# Patient Record
Sex: Male | Born: 1985 | Race: White | Hispanic: No | Smoking: Never smoker
Health system: Southern US, Community
[De-identification: ages and names within clinical notes are randomized; demographics above are authoritative.]

---

## 2016-12-31 ENCOUNTER — Other Ambulatory Visit: Payer: Self-pay | Admitting: *Deleted

## 2016-12-31 DIAGNOSIS — M21372 Foot drop, left foot: Secondary | ICD-10-CM

## 2017-01-04 ENCOUNTER — Ambulatory Visit (INDEPENDENT_AMBULATORY_CARE_PROVIDER_SITE_OTHER): Payer: BLUE CROSS/BLUE SHIELD | Admitting: Neurology

## 2017-01-04 DIAGNOSIS — M21372 Foot drop, left foot: Secondary | ICD-10-CM

## 2017-01-04 NOTE — Procedures (Signed)
[facility]  [address], [address]  [place], [place] [address] Tel: [phone] Fax:  [phone] Test Date:  01/04/2017  Patient: Douglas Montoya DOB: 08/13/1985 Physician: Nita Sickleonika Momina Hunton, DO  Sex: Male Height: 6' " Ref Phys: Albertha Gheeebecca Bassett, MD  ID#: [id number] Temp: 33.2C Technician:    Patient Complaints: This is a 31 year-old gentleman referred for evaluation of intermittent left foot drop, pain, and swelling.  NCV & EMG Findings: Extensive electrodiagnostic testing of the left lower extremity and additional studies of the right shows:  1. Bilateral superficial peroneal sensory responses are symmetric and within normal limits. Left sural sensory responses also within normal limits. 2. Bilateral peroneal motor responses at the extensor digitorum brevis showed reduced amplitude, worse on the left (L1.8, R2.6 mV). Bilateral peroneal motor response at the tibialis anterior are within normal limits. 3. Sparse chronic motor axon loss changes are isolated to bilateral extensor digitorum brevis muscles, with increased insertional activity on the left.    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  In particular, there is no evidence of a lumbosacral radiculopathy or left entrapment neuropathy.   ___________________________ Nita Sickleonika Aadit Hagood, DO    Nerve Conduction Studies Anti Sensory Summary Table   Site NR Peak (ms) Norm Peak (ms) P-T Amp (V) Norm P-T Amp  Left Sup Peroneal Anti Sensory (Ant Lat Mall)  33.2C  12 cm    3.1 <4.5 11.8 >5  Site 2    3.1  11.7   Right Sup Peroneal Anti Sensory (Ant Lat Mall)  33.2C  12 cm    2.6 <4.5 11.3 >5  Left Sural Anti Sensory (Lat Mall)  33.2C  Calf    3.7 <4.5 8.7 >5   Motor Summary Table   Site NR Onset (ms) Norm Onset (ms) O-P Amp (mV) Norm O-P Amp  Site1 Site2 Delta-0 (ms) Dist (cm) Vel (m/s) Norm Vel (m/s)  Left Peroneal Motor (Ext Dig Brev)  33.2C  Ankle    3.4 <5.5 1.8 >3 B Fib Ankle 7.5 37.0 49 >40  B Fib    10.9  1.6  Poplt B Fib 1.6 10.0 63 >40  Poplt    12.5  1.5         Right Peroneal Motor (Ext Dig Brev)  33.2C  Ankle    3.6 <5.5 2.6 >3 B Fib Ankle 7.2 38.0 53 >40  B Fib    10.8  2.3  Poplt B Fib 1.2 8.0 67 >40  Poplt    12.0  2.4         Left Peroneal TA Motor (Tib Ant)  33.2C  Fib Head    3.3 <4.0 6.3 >4 Poplit Fib Head 0.8 10.0 125 >40  Poplit    4.1  6.0         Right Peroneal TA Motor (Tib Ant)  33.2C  Fib Head    2.8 <4.0 9.1 >4 Poplit Fib Head 1.0 8.0 80 >40  Poplit    3.8  8.8         Left Tibial Motor (Abd Hall Brev)  33.2C  Ankle    5.1 <6.0 11.4 >8 Knee Ankle 7.3 43.0 59 >40  Knee    12.4  10.0          H Reflex Studies   NR H-Lat (ms) Lat Norm (ms) L-R H-Lat (ms)  Left Tibial (Gastroc)  33.2C     34.15 <35    EMG   Side Muscle Ins Act Fibs Psw Fasc Number Recrt Dur Dur. Amp Amp. Poly Poly. Comment  Left AntTibialis Nml Nml Nml Nml Nml Nml Nml Nml Nml Nml Nml Nml N/A  Left Gastroc Nml Nml Nml Nml Nml Nml Nml Nml Nml Nml Nml Nml N/A  Left Flex Dig Long Nml Nml Nml Nml Nml Nml Nml Nml Nml Nml Nml Nml N/A  Left Ext Dig Brev 1+ Nml Nml Nml 1- Rapid Few 1+ Few 1+ Nml Nml N/A  Left RectFemoris Nml Nml Nml Nml Nml Nml Nml Nml Nml Nml Nml Nml N/A  Left GluteusMed Nml Nml Nml Nml Nml Nml Nml Nml Nml Nml Nml Nml N/A  Right AntTibialis Nml Nml Nml Nml Nml Nml Nml Nml Nml Nml Nml Nml N/A  Right Ext Dig Brev Nml Nml Nml Nml 1- Rapid Few 1+ Few 1+ Nml Nml N/A      Waveforms:

## 2019-06-20 ENCOUNTER — Other Ambulatory Visit: Payer: Self-pay | Admitting: Podiatry

## 2019-06-20 ENCOUNTER — Ambulatory Visit: Payer: BC Managed Care – PPO | Admitting: Podiatry

## 2019-06-20 ENCOUNTER — Ambulatory Visit (INDEPENDENT_AMBULATORY_CARE_PROVIDER_SITE_OTHER): Payer: BC Managed Care – PPO

## 2019-06-20 ENCOUNTER — Encounter: Payer: Self-pay | Admitting: Podiatry

## 2019-06-20 ENCOUNTER — Other Ambulatory Visit: Payer: Self-pay

## 2019-06-20 DIAGNOSIS — G5763 Lesion of plantar nerve, bilateral lower limbs: Secondary | ICD-10-CM | POA: Diagnosis not present

## 2019-06-20 DIAGNOSIS — M778 Other enthesopathies, not elsewhere classified: Secondary | ICD-10-CM

## 2019-06-20 DIAGNOSIS — M2041 Other hammer toe(s) (acquired), right foot: Secondary | ICD-10-CM

## 2019-06-25 NOTE — Progress Notes (Signed)
   HPI: 33 y.o. male presenting today 33 year old male otherwise healthy presents for evaluation regarding bilateral foot pain.  Patient has seen 3 previous doctors for the same symptoms over the past 3 months without a definitive diagnosis.  Patient states that for the past 7 years he gets intermittent swelling and inflammation to his bilateral feet.  He is unable to walk long distances without pain.  He has tried different anti-inflammatories and is currently on gabapentin.  He has had MRIs in the past which have not helped to confirm any diagnosis previously.  He presents today for another opinion and to see if there is any other treatment options.  History reviewed. No pertinent past medical history.   Physical Exam: General: The patient is alert and oriented x3 in no acute distress.  Dermatology: Skin is warm, dry and supple bilateral lower extremities. Negative for open lesions or macerations.  Vascular: Palpable pedal pulses bilaterally. No edema or erythema noted. Capillary refill within normal limits.  Neurological: Epicritic and protective threshold grossly intact bilaterally.   Musculoskeletal Exam: Range of motion within normal limits to all pedal and ankle joints bilateral. Muscle strength 5/5 in all groups bilateral.  Tenderness to palpation along the dorsal aspect of bilateral feet.  There is also pain on palpation to the fourth interspace bilaterally consistent with possible Morton's neuroma.  Radiographic Exam:  Normal osseous mineralization. Joint spaces preserved. No fracture/dislocation/boney destruction.    Assessment: 1.  Intermittent swelling/inflammation with activity bilateral feet along the fourth ray 2.  Morton's neuroma fourth interspace bilateral   Plan of Care:  1. Patient evaluated. X-Rays reviewed.  2.  The patient has been diagnosed with several previous diagnoses that have not been truly definitive including gout, and the patient has had elevated uric acids  in the past.  This is somewhat inconsistent clinically due to the fact that when the patient is on his feet for longer than a short period of time he immediately gets pain and tenderness for the next 2-3 days following. 3.  I did explain to the patient that I am not 100% sure of any other diagnosis that is been overlooked.  Patient has received some relief from a neuroma injection he received by Dr. Odis Hollingshead in the past. 4.  Injection of 0.5 cc Celestone Soluspan injection of the fourth interspace left.  Patient recently had an injection to the fourth interspace of the right foot.   5.  Recommend that the patient goes about his normal walking activity and see if the injections help to improve his pain to rule out neuromas bilaterally 6.  Return to clinic in 1 week to see if there is improvement      Edrick Kins, DPM [facility]  Dr. Edrick Kins, DPM    [address], [place] [address]                Office [phone]  Fax [phone]

## 2019-06-27 ENCOUNTER — Ambulatory Visit: Payer: BC Managed Care – PPO | Admitting: Podiatry

## 2019-06-27 ENCOUNTER — Other Ambulatory Visit: Payer: Self-pay

## 2019-06-27 DIAGNOSIS — G5763 Lesion of plantar nerve, bilateral lower limbs: Secondary | ICD-10-CM | POA: Diagnosis not present

## 2019-07-02 NOTE — Progress Notes (Signed)
   Subjective: 33 y.o. male presenting today for follow up evaluation of bilateral Morton's neuromas. He states the pain has improved. He does reports some pain to the lateral aspect of the feet. He states the injections helped to alleviate his pain. He denies any current aggravating factors. Patient is here for further evaluation and treatment.   No past medical history on file.   Objective: Physical Exam General: The patient is alert and oriented x3 in no acute distress.  Dermatology: Skin is cool, dry and supple bilateral lower extremities. Negative for open lesions or macerations.  Vascular: Palpable pedal pulses bilaterally. No edema or erythema noted. Capillary refill within normal limits.  Neurological: Epicritic and protective threshold grossly intact bilaterally.   Musculoskeletal Exam: Clinical evidence of Tailor's bunion deformity noted to the respective foot. There is a moderate pain on palpation range of motion of the fifth MPJ.    Assessment: 1. Tailor's bunion deformity bilateral  2. Morton's neuroma bilateral 3rd interspace - resolved    Plan of Care:  1. Patient was evaluated.  2. Continue wearing good shoe gear.  3. Injections seemed to have relieved patient's symptoms.  4. Continue conservative treatment for now.  5. Return to clinic as needed for injections.        Edrick Kins, DPM [facility]  Dr. Edrick Kins, [address]                                        [place], [place] [address]                Office [phone]  Fax [phone]

## 2019-07-09 ENCOUNTER — Encounter: Payer: Self-pay | Admitting: Podiatry

## 2019-07-09 ENCOUNTER — Other Ambulatory Visit: Payer: Self-pay

## 2019-07-09 ENCOUNTER — Ambulatory Visit: Payer: BC Managed Care – PPO | Admitting: Podiatry

## 2019-07-09 DIAGNOSIS — G5763 Lesion of plantar nerve, bilateral lower limbs: Secondary | ICD-10-CM | POA: Diagnosis not present

## 2019-07-15 NOTE — Progress Notes (Signed)
   Subjective: 34 y.o. male presenting today for follow up evaluation of bilateral Morton's neuromas of the 3rd interspace and a Tailor's bunion of the left foot. He reports pain from the neuromas in the past week. Walking increases the pain. He has not done anything at home recently for treatment. Patient is here for further evaluation and treatment.   History reviewed. No pertinent past medical history.   Objective: Physical Exam General: The patient is alert and oriented x3 in no acute distress.  Dermatology: Skin is cool, dry and supple bilateral lower extremities. Negative for open lesions or macerations.  Vascular: Palpable pedal pulses bilaterally. No edema or erythema noted. Capillary refill within normal limits.  Neurological: Epicritic and protective threshold grossly intact bilaterally.   Musculoskeletal Exam: Clinical evidence of Tailor's bunion deformity noted to the respective foot. There is a moderate pain on palpation range of motion of the fifth MPJ.    Assessment: 1. Tailor's bunion deformity left  2. Morton's neuroma bilateral 3rd interspace - currently asymptomatic    Plan of Care:  1. Patient was evaluated.  2. Injection of 0.5 mLs Celestone Soluspan injected into the Morton's neuroma of the right foot and the IDCN.  3. Recommended good shoe gear.  4. Return to clinic as needed. If symptoms recur, we will discuss surgery.       Felecia Shelling, DPM [facility]  Dr. Felecia Shelling, DPM    [address]                                        [place], [place] [address]                Office [phone]  Fax [phone]

## 2019-07-23 ENCOUNTER — Other Ambulatory Visit: Payer: Self-pay

## 2019-07-23 ENCOUNTER — Encounter: Payer: Self-pay | Admitting: Podiatry

## 2019-07-23 ENCOUNTER — Ambulatory Visit (INDEPENDENT_AMBULATORY_CARE_PROVIDER_SITE_OTHER): Payer: BC Managed Care – PPO | Admitting: Podiatry

## 2019-07-23 DIAGNOSIS — G5763 Lesion of plantar nerve, bilateral lower limbs: Secondary | ICD-10-CM | POA: Diagnosis not present

## 2019-07-23 DIAGNOSIS — M21621 Bunionette of right foot: Secondary | ICD-10-CM

## 2019-07-23 DIAGNOSIS — M21622 Bunionette of left foot: Secondary | ICD-10-CM | POA: Diagnosis not present

## 2019-07-26 ENCOUNTER — Telehealth: Payer: Self-pay | Admitting: Podiatry

## 2019-07-26 ENCOUNTER — Telehealth: Payer: Self-pay | Admitting: *Deleted

## 2019-07-26 NOTE — Telephone Encounter (Signed)
"  I need some information about my surgery."  What date are you scheduled?  "I'm scheduled to have surgery on March 4."  What questions do you have?  How can I help you?  "How long will I need to be out of work?"  You'll probably be out about six to eight weeks.  "Okay, that's all I needed."

## 2019-07-26 NOTE — Telephone Encounter (Signed)
Douglas Montoya would like to speak with you about his surgery, he said that he is scheduled for 09/13/2019, but he has some other questions. He would like for you to give him a call ASAP.

## 2019-07-26 NOTE — Progress Notes (Signed)
   Subjective: 34 y.o. male presenting today for follow up evaluation of bilateral Morton's neuromas of the 3rd interspace and Tailor's bunion of the bilateral feet. He states he has been off of the feet for the past two weeks. He denies any swelling but reports some continued pain. He states the injections he got at his last appointment helped alleviate the pain for a few days. Patient is here for further evaluation and treatment.   No past medical history on file.   Objective: Physical Exam General: The patient is alert and oriented x3 in no acute distress.  Dermatology: Skin is cool, dry and supple bilateral lower extremities. Negative for open lesions or macerations.  Vascular: Palpable pedal pulses bilaterally. No edema or erythema noted. Capillary refill within normal limits.  Neurological: Epicritic and protective threshold grossly intact bilaterally.   Musculoskeletal Exam: Clinical evidence of Tailor's bunion deformity noted to the respective foot. There is a moderate pain on palpation range of motion of the fifth MPJ.  Sharp pain with palpation of the bilateral 3rd interspace and lateral compression of the metatarsal heads consistent with neuroma.  Positive Lendell Caprice sign with loadbearing of the forefoot.    Assessment: 1. Tailor's bunion deformity bilateral  2. Morton's neuroma bilateral 3rd interspace - recurrent    Plan of Care:  1. Patient was evaluated.  2. Today we discussed the conservative versus surgical management of the presenting pathology. The patient opts for surgical management. All possible complications and details of the procedure were explained. All patient questions were answered. No guarantees were expressed or implied. 3. Authorization for surgery was initiated today. Surgery will consist of Morton's neurectomy bilateral 3rd interspace; Tailor's bunionectomy with 5th metatarsal osteotomy bilateral.  4. Return to clinic one week post op.      Felecia Shelling, DPM [facility]  Dr. Felecia Shelling, DPM    [address]                                        [place], [place] [address]                Office [phone]  Fax [phone]

## 2019-08-01 ENCOUNTER — Ambulatory Visit: Payer: BC Managed Care – PPO | Admitting: Podiatry

## 2019-09-05 ENCOUNTER — Telehealth: Payer: Self-pay

## 2019-09-05 NOTE — Telephone Encounter (Signed)
DOS 09/13/19  METATARSAL OSTEOTOMY 5TH B/L - 28308 NEURECTOMY 3RD UINTERSPACE B/L-28080  [employer] EFFECTIVE DATE - 07/12/2018  PLAN DEDUCTIBLE - $600 W/ $472.67 REMAINING  OUT OF POCKET - $3500 W/ $3272.67 REMAINING  CO INSURANCE - 20%  SPOKE TO Douglas Montoya AT [facility] MANAGEMENT [phone], PRE-CERT NOT REQUIRED FOR THIS POLICY.  CALL REF # - [phone]

## 2019-09-13 ENCOUNTER — Encounter: Payer: Self-pay | Admitting: Podiatry

## 2019-09-13 ENCOUNTER — Other Ambulatory Visit: Payer: Self-pay | Admitting: Podiatry

## 2019-09-13 DIAGNOSIS — M21541 Acquired clubfoot, right foot: Secondary | ICD-10-CM

## 2019-09-13 DIAGNOSIS — G5761 Lesion of plantar nerve, right lower limb: Secondary | ICD-10-CM

## 2019-09-13 DIAGNOSIS — M21542 Acquired clubfoot, left foot: Secondary | ICD-10-CM

## 2019-09-13 DIAGNOSIS — G5762 Lesion of plantar nerve, left lower limb: Secondary | ICD-10-CM

## 2019-09-13 MED ORDER — OXYCODONE-ACETAMINOPHEN 5-325 MG PO TABS: 1.0000 | ORAL_TABLET | Freq: Four times a day (QID) | ORAL | 0 refills | Status: DC | PRN

## 2019-09-13 NOTE — Progress Notes (Signed)
PRN postop 

## 2019-09-17 ENCOUNTER — Telehealth: Payer: Self-pay | Admitting: Podiatry

## 2019-09-17 NOTE — Telephone Encounter (Signed)
Pt states in the left upper foot and ankle, it doesn't feel like surgery pain, and his rheumatology appt before the surgery he informed his rheumatologist that he felt something was coming on and he was prescribed prednisone, but was told not to take until later in the surgery. Pt states he redressed the surgery foot without lasting pain relief results. I asked pt if he had another medication also. Pt states colchicine. I told pt he could take that daily until the symptoms disappeared but not at the same time as the pain medication. Pt states understanding.

## 2019-09-17 NOTE — Telephone Encounter (Signed)
Pt had surgery on 09/13/19 and is experiencing pain and swelling in his ankle and would like to know if he able to start taking his medication again for gout. He believes he is having a flare up. Please give patient a call.

## 2019-09-17 NOTE — Telephone Encounter (Signed)
Sure

## 2019-09-24 ENCOUNTER — Other Ambulatory Visit: Payer: Self-pay

## 2019-09-24 ENCOUNTER — Ambulatory Visit (INDEPENDENT_AMBULATORY_CARE_PROVIDER_SITE_OTHER): Payer: BC Managed Care – PPO | Admitting: Podiatry

## 2019-09-24 ENCOUNTER — Ambulatory Visit (INDEPENDENT_AMBULATORY_CARE_PROVIDER_SITE_OTHER): Payer: BC Managed Care – PPO

## 2019-09-24 VITALS — Temp 95.1°F

## 2019-09-24 DIAGNOSIS — G5763 Lesion of plantar nerve, bilateral lower limbs: Secondary | ICD-10-CM

## 2019-09-24 DIAGNOSIS — Z9889 Other specified postprocedural states: Secondary | ICD-10-CM

## 2019-09-24 DIAGNOSIS — M10072 Idiopathic gout, left ankle and foot: Secondary | ICD-10-CM | POA: Diagnosis not present

## 2019-09-24 MED ORDER — OXYCODONE-ACETAMINOPHEN 5-325 MG PO TABS: 1.0000 | ORAL_TABLET | Freq: Four times a day (QID) | ORAL | 0 refills | Status: AC | PRN

## 2019-09-24 MED ORDER — CYCLOBENZAPRINE HCL 10 MG PO TABS: 10.0000 mg | ORAL_TABLET | Freq: Three times a day (TID) | ORAL | 0 refills | Status: AC | PRN

## 2019-09-26 NOTE — Progress Notes (Signed)
   Subjective:  Patient presents today status post neurectomy 3rd interspace and Tailor's bunionectomy bilateral. DOS: 09/13/2019. She reports moderate to severe pain of the left ankle. He states he believes he is have a gout exacerbation that began the day after surgery and has worsened in the last four days. He states he is out of Prednisone and Colchicine for gout treatment. He has been trying to stay off his feet and rest them as much as possible. Patient is here for further evaluation and treatment.    No past medical history on file.    Objective/Physical Exam Neurovascular status intact.  Skin incisions appear to be well coapted with sutures and staples intact. No sign of infectious process noted. No dehiscence. No active bleeding noted. Moderate edema noted to the surgical extremity. Pain with palpation, erythema and edema noted to the left ankle joint.   Radiographic Exam:  Orthopedic hardware and osteotomies sites appear to be stable with routine healing.  Assessment: 1. s/p neurectomy 3rd interspace and Tailor's bunionectomy bilateral. DOS: 09/13/2019 2. Acute gout inflammation left ankle    Plan of Care:  1. Patient was evaluated. X-rays reviewed 2. Injection of 0.5 mLs Celestone Soluspan injected into the left ankle joint.  3. Dressing changed.  4. CAM boot dispensed for left lower extremity.  5. Continue minimal weightbearing bilaterally.  6. Return to clinic in one week for suture removal.    Felecia Shelling, DPM [facility]  Dr. Felecia Shelling, DPM    [address]                                        [place], [place] [address]                Office [phone]  Fax [phone]

## 2019-09-28 ENCOUNTER — Encounter: Payer: Self-pay | Admitting: Podiatry

## 2019-10-01 ENCOUNTER — Encounter: Payer: BC Managed Care – PPO | Admitting: Podiatry

## 2019-10-03 ENCOUNTER — Ambulatory Visit (INDEPENDENT_AMBULATORY_CARE_PROVIDER_SITE_OTHER): Payer: BC Managed Care – PPO | Admitting: Podiatry

## 2019-10-03 ENCOUNTER — Other Ambulatory Visit: Payer: Self-pay

## 2019-10-03 DIAGNOSIS — G5763 Lesion of plantar nerve, bilateral lower limbs: Secondary | ICD-10-CM

## 2019-10-03 DIAGNOSIS — Z9889 Other specified postprocedural states: Secondary | ICD-10-CM

## 2019-10-07 NOTE — Progress Notes (Signed)
   Subjective:  Patient presents today status post neurectomy 3rd interspace and Tailor's bunionectomy bilateral. DOS: 09/13/2019. He states he is doing well. He denies any significant pain. He reports some mild associated swelling. He states the injection helped alleviate his gout symptoms. He denies worsening factors at this time. Patient is here for further evaluation and treatment.    No past medical history on file.    Objective/Physical Exam Neurovascular status intact.  Skin incisions appear to be well coapted with sutures and staples intact. No sign of infectious process noted. No dehiscence. No active bleeding noted. Moderate edema noted to the surgical extremity.  Assessment: 1. s/p neurectomy 3rd interspace and Tailor's bunionectomy bilateral. DOS: 09/13/2019 2. Acute gout inflammation left ankle - resolved    Plan of Care:  1. Patient was evaluated. 2. Sutures removed.  3. Ace wraps dispensed.  4. Continue weightbearing in post op shoes.  5. Return to clinic in 2 weeks for follow up X-Ray.    Felecia Shelling, DPM [facility]  Dr. Felecia Shelling, DPM    [address]                                        [place], [place] [address]                Office [phone]  Fax [phone]

## 2019-10-15 ENCOUNTER — Ambulatory Visit (INDEPENDENT_AMBULATORY_CARE_PROVIDER_SITE_OTHER): Payer: BC Managed Care – PPO | Admitting: Podiatry

## 2019-10-15 ENCOUNTER — Ambulatory Visit (INDEPENDENT_AMBULATORY_CARE_PROVIDER_SITE_OTHER): Payer: BC Managed Care – PPO

## 2019-10-15 ENCOUNTER — Other Ambulatory Visit: Payer: Self-pay

## 2019-10-15 DIAGNOSIS — Z9889 Other specified postprocedural states: Secondary | ICD-10-CM | POA: Diagnosis not present

## 2019-10-15 DIAGNOSIS — G5763 Lesion of plantar nerve, bilateral lower limbs: Secondary | ICD-10-CM | POA: Diagnosis not present

## 2019-10-15 DIAGNOSIS — M10072 Idiopathic gout, left ankle and foot: Secondary | ICD-10-CM

## 2019-10-18 NOTE — Progress Notes (Signed)
   Subjective:  Patient presents today status post neurectomy 3rd interspace and Tailor's bunionectomy bilateral. DOS: 09/13/2019. He states he is doing well and improving. He is concerned about a possible gout exacerbation of the left ankle. He reports associated pain, swelling and redness of the joint. Touching the area increases the pain. He has not done anything at home for treatment. Patient is here for further evaluation and treatment.    No past medical history on file.    Objective/Physical Exam Neurovascular status intact.  Skin incisions appear to be well coapted. No sign of infectious process noted. No dehiscence. No active bleeding noted. Moderate edema noted to the surgical extremity. Pain with palpation, erythema and edema noted to the left ankle joint.   Radiographic Exam:  Orthopedic hardware and osteotomies sites appear to be stable with routine healing.  Assessment: 1. s/p neurectomy 3rd interspace and Tailor's bunionectomy bilateral. DOS: 09/13/2019 2. Acute gout inflammation left ankle - recurrent    Plan of Care:  1. Patient was evaluated. X-Rays reviewed.  2. Injection of 0.5 mLs Celestone Soluspan injected into the left ankle joint.  3. Patient has follow up with rheumatology.  4. Recommended good shoe gear.  5. Return to clinic in 4 weeks for final follow up visit.     Felecia Shelling, DPM [facility]  Dr. Felecia Shelling, DPM    [address]                                        [place], [place] [address]                Office [phone]  Fax [phone]

## 2019-11-12 ENCOUNTER — Ambulatory Visit (INDEPENDENT_AMBULATORY_CARE_PROVIDER_SITE_OTHER): Payer: BC Managed Care – PPO | Admitting: Podiatry

## 2019-11-12 ENCOUNTER — Other Ambulatory Visit: Payer: Self-pay

## 2019-11-12 DIAGNOSIS — M21621 Bunionette of right foot: Secondary | ICD-10-CM

## 2019-11-12 DIAGNOSIS — G5763 Lesion of plantar nerve, bilateral lower limbs: Secondary | ICD-10-CM

## 2019-11-12 DIAGNOSIS — Z9889 Other specified postprocedural states: Secondary | ICD-10-CM

## 2019-11-12 DIAGNOSIS — M21622 Bunionette of left foot: Secondary | ICD-10-CM

## 2019-11-14 NOTE — Progress Notes (Signed)
   Subjective:  Patient presents today status post neurectomy 3rd interspace and Tailor's bunionectomy bilateral. DOS: 09/13/2019. He reports aching pain between the 4th and 5th toes. Moving the feet increases the pain. Patient is here for further evaluation and treatment.   No past medical history on file.    Objective/Physical Exam Neurovascular status intact.  Skin incisions appear to be well coapted. No sign of infectious process noted. No dehiscence. No active bleeding noted. Moderate edema noted to the surgical extremity.  Assessment: 1. s/p neurectomy 3rd interspace and Tailor's bunionectomy bilateral. DOS: 09/13/2019 2. Acute gout inflammation left ankle - resolved    Plan of Care:  1. Patient was evaluated.  2. Continue management with rheumatology.  3. From a surgical standpoint, may resume full activity with no restrictions.  4. Recommended good shoe gear. 5. Return to clinic as needed.     Felecia Shelling, DPM [facility]  Dr. Felecia Shelling, DPM    [address]                                        [place], [place] [address]                Office [phone]  Fax [phone]

## 2019-12-04 ENCOUNTER — Encounter: Payer: Self-pay | Admitting: Podiatry

## 2020-01-24 ENCOUNTER — Other Ambulatory Visit: Payer: Self-pay | Admitting: Neurological Surgery

## 2020-01-24 ENCOUNTER — Other Ambulatory Visit: Payer: Self-pay | Admitting: Family Medicine

## 2020-01-24 DIAGNOSIS — M5416 Radiculopathy, lumbar region: Secondary | ICD-10-CM

## 2020-02-18 ENCOUNTER — Other Ambulatory Visit: Payer: BC Managed Care – PPO

## 2020-03-06 ENCOUNTER — Other Ambulatory Visit: Payer: Self-pay | Admitting: Physician Assistant

## 2020-03-06 DIAGNOSIS — M5416 Radiculopathy, lumbar region: Secondary | ICD-10-CM

## 2020-03-10 ENCOUNTER — Other Ambulatory Visit: Payer: Self-pay | Admitting: Physician Assistant

## 2020-03-11 ENCOUNTER — Other Ambulatory Visit: Payer: Self-pay | Admitting: Physician Assistant

## 2020-03-11 ENCOUNTER — Other Ambulatory Visit: Payer: BC Managed Care – PPO

## 2020-03-11 DIAGNOSIS — M533 Sacrococcygeal disorders, not elsewhere classified: Secondary | ICD-10-CM

## 2020-03-27 ENCOUNTER — Other Ambulatory Visit: Payer: Self-pay | Admitting: Specialist

## 2020-03-27 DIAGNOSIS — M5417 Radiculopathy, lumbosacral region: Secondary | ICD-10-CM

## 2020-04-03 ENCOUNTER — Ambulatory Visit
Admission: RE | Admit: 2020-04-03 | Discharge: 2020-04-03 | Disposition: A | Discharge status: Home / Self Care | Payer: BC Managed Care – PPO | Source: Ambulatory Visit | Attending: Physician Assistant | Admitting: Physician Assistant

## 2020-04-03 DIAGNOSIS — M533 Sacrococcygeal disorders, not elsewhere classified: Secondary | ICD-10-CM

## 2020-04-03 MED ORDER — GADOBENATE DIMEGLUMINE 529 MG/ML IV SOLN
20.0000 mL | Freq: Once | INTRAVENOUS | Status: AC | PRN
  Administered 2020-04-03: 20 mL via INTRAVENOUS

## 2020-04-17 ENCOUNTER — Other Ambulatory Visit: Payer: BC Managed Care – PPO

## 2021-01-10 IMAGING — MR MR PELVIS WO/W CM
6 of 8 series · 35 of 48 positions shown · IV contrast (20ml Multihance)
Comparison: None.

CLINICAL DATA: Chronic intermittent right sacroiliac joint pain.

EXAM:
MRI PELVIS WITHOUT AND WITH CONTRAST
TECHNIQUE: Multiplanar multisequence MR imaging of the pelvis was performed
both before and after administration of intravenous contrast.
CONTRAST:  20mL MULTIHANCE GADOBENATE DIMEGLUMINE 529 MG/ML IV SOLN

[Series 3: T2 fat-sat · sagittal · 4.0mm · 0.75mm/px · 6 of 35 slices shown (1 of 2)]
[im 1/35]
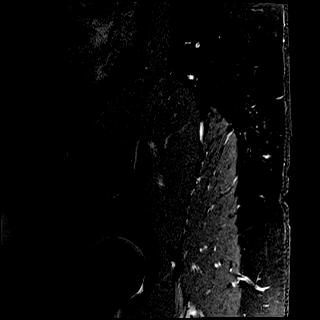
[im 7/35]
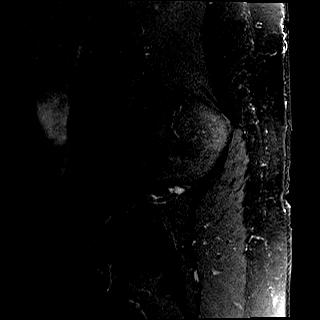
[im 14/35]
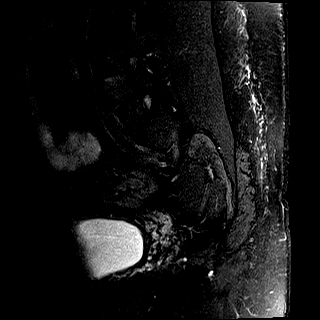
[im 21/35]
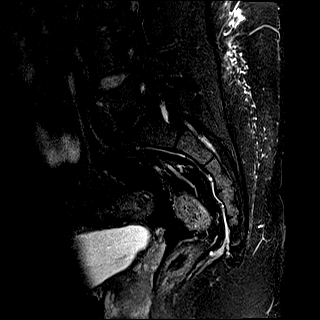
[im 28/35]
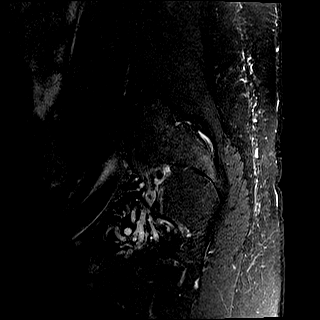
[im 35/35]
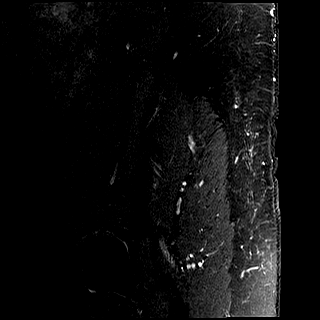

[Series 4: T1 · coronal · 3.0mm · 0.88mm/px · 4 of 26 slices shown (1 of 2)]
[im 1/26]
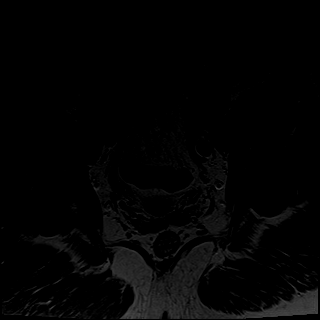
[im 9/26]
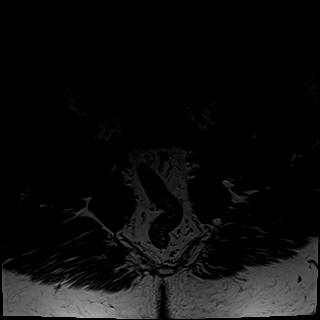
[im 17/26]
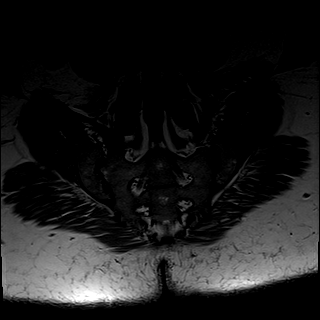
[im 26/26]
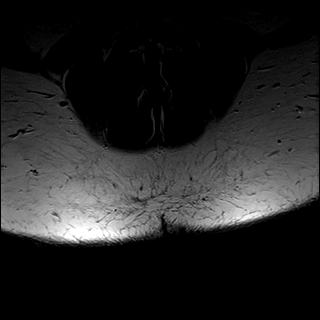

[Series 5: STIR · coronal · 3.0mm · 0.55mm/px · 4 of 26 slices shown]
[im 1/26]
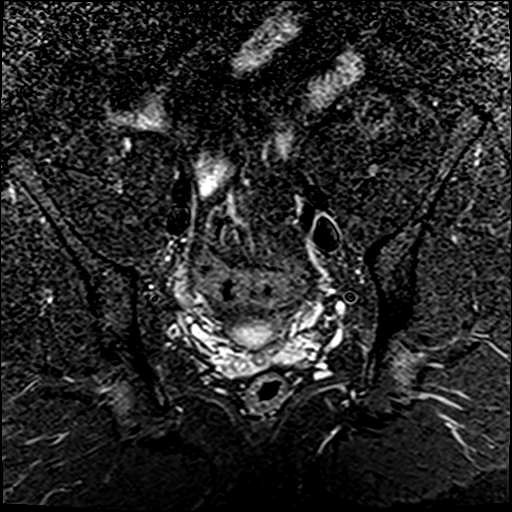
[im 9/26]
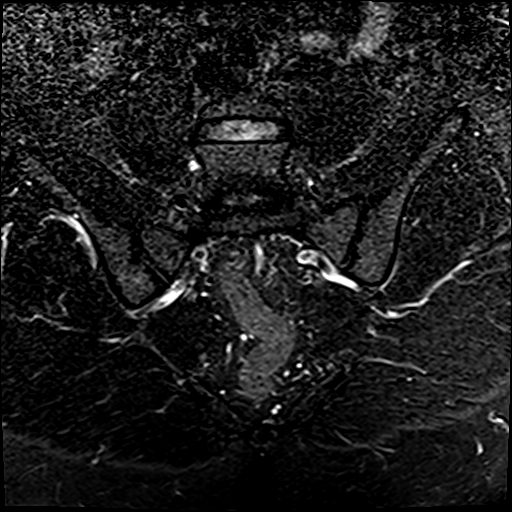
[im 17/26]
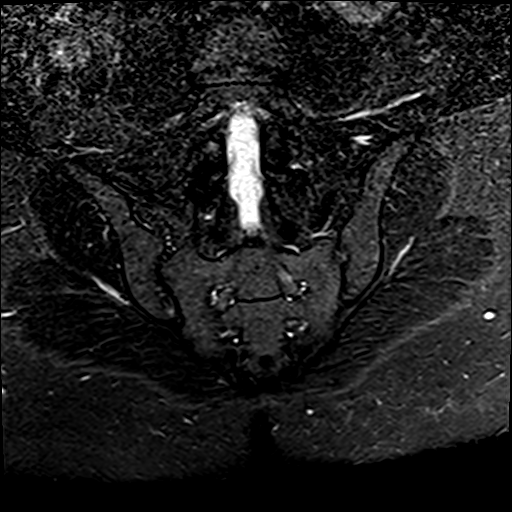
[im 26/26]
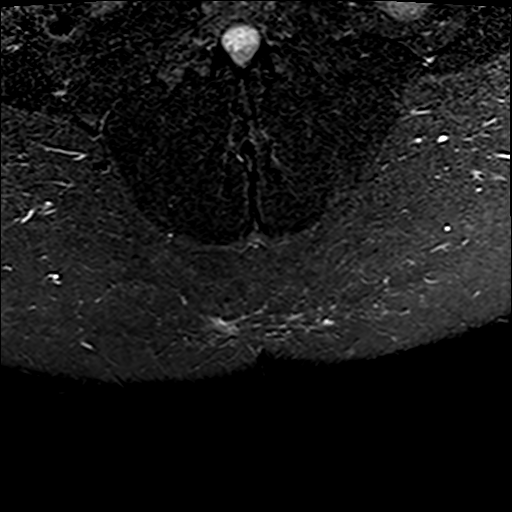

[Series 6: T2 fat-sat · axial · 4.0mm · 0.94mm/px · z∈[+6,+221]mm · 7 of 44 slices shown (2 of 2)]
[im 1/44]
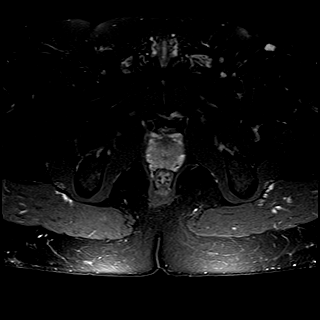
[im 8/44]
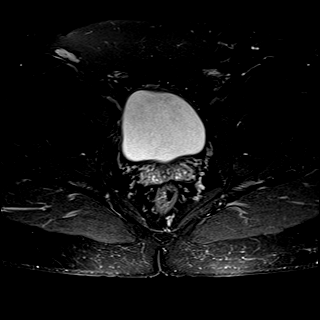
[im 15/44]
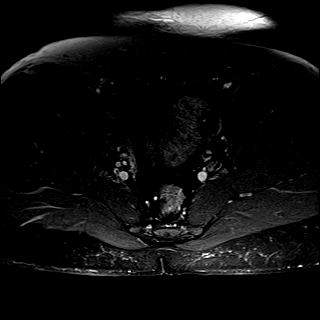
[im 22/44]
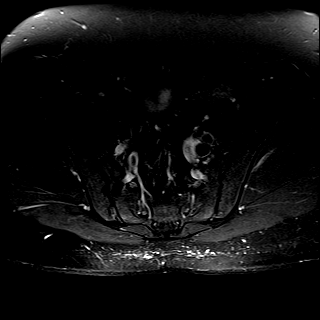
[im 29/44]
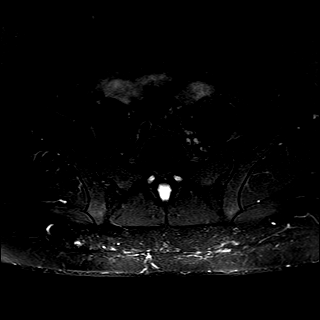
[im 36/44]
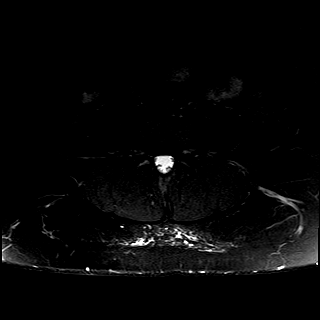
[im 44/44]
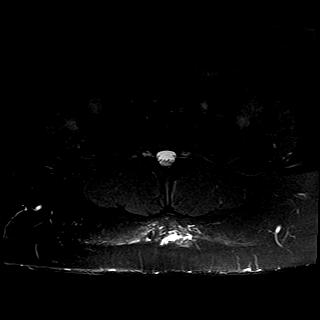

[Series 7: T1 · axial · 4.0mm · 0.94mm/px · z∈[+6,+221]mm · 7 of 44 slices shown (2 of 2)]
[im 1/44]
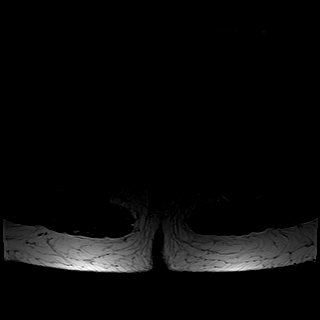
[im 8/44]
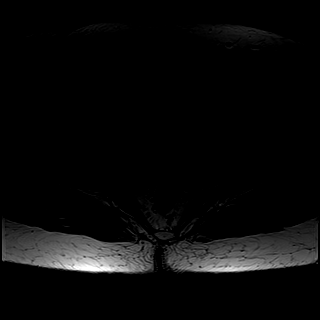
[im 15/44]
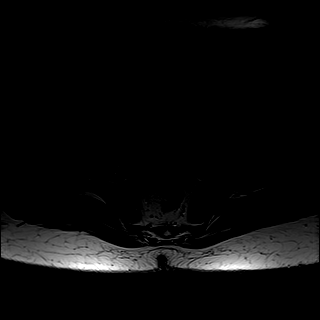
[im 22/44]
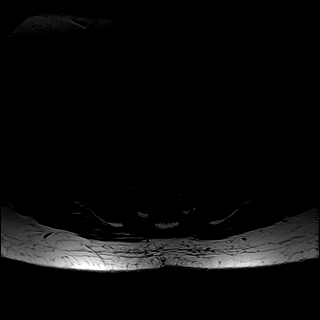
[im 29/44]
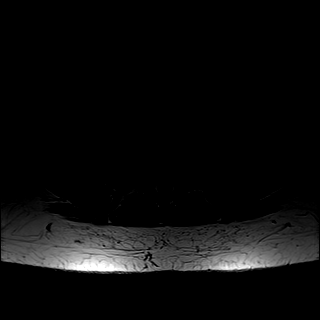
[im 36/44]
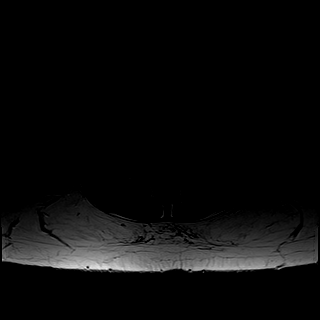
[im 44/44]
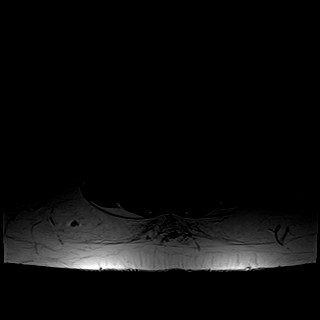

[Series 8: T1 fat-sat · axial · non-contrast · 4.0mm · 0.59mm/px · z∈[-4,+196]mm · 7 of 48 slices shown]
[im 1/48]
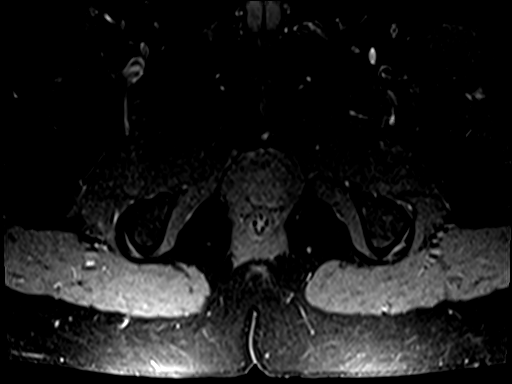
[im 7/48]
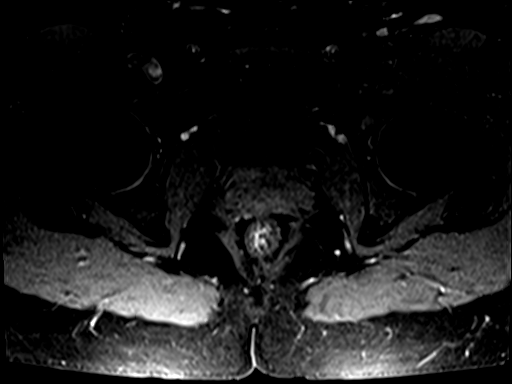
[im 14/48]
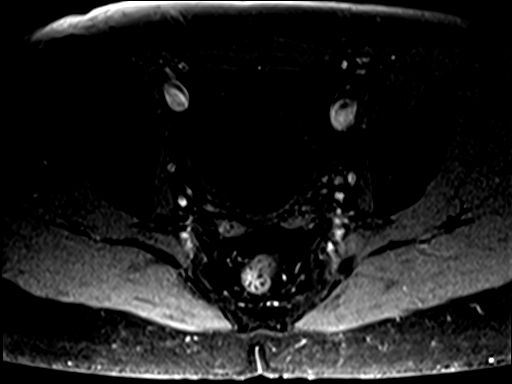
[im 21/48]
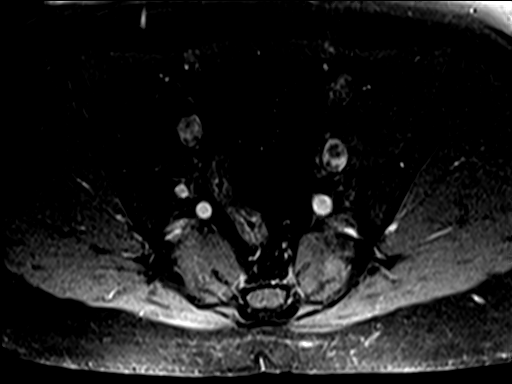
[im 27/48]
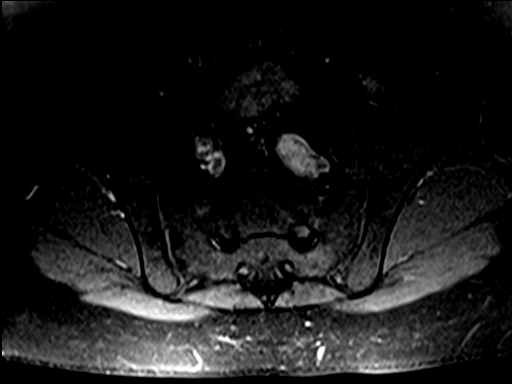
[im 34/48]
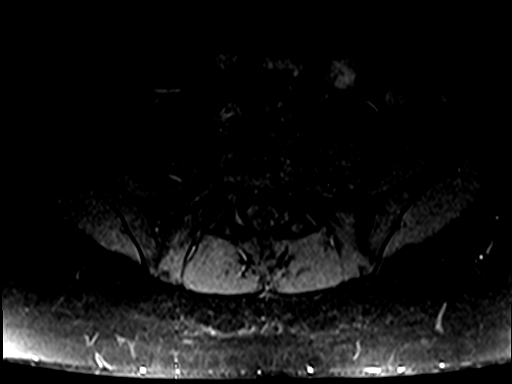
[im 41/48]
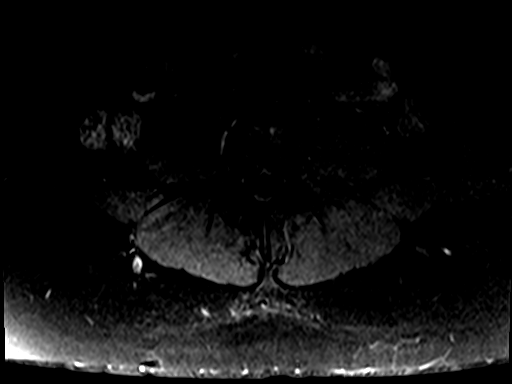

[35 of 48 positions shown; findings below may reference images not displayed]

FINDINGS: Bones/Joint/Cartilage

Chronic degenerative fatty subchondral marrow signal abnormality
along the right sacroiliac joint without erosive changes. The left
sacroiliac joint is unremarkable. No joint effusion. No acute
fracture or dislocation. No suspicious bone lesion. Degenerative
disc disease and chronic degenerative fatty marrow endplate changes
at L5-S1.

Muscles and Tendons
Intact.  No muscle edema or atrophy.

Soft tissue
No fluid collection or hematoma. No soft tissue mass. The visualized
internal pelvic contents are unremarkable.
IMPRESSION: 1. Chronic degenerative changes of the right sacroiliac joint. No
erosive changes to suggest inflammatory arthropathy.
2. Normal left sacroiliac joint.
